# Patient Record
Sex: Female | Born: 1975 | Race: White
Health system: Northeastern US, Academic
[De-identification: ages and names within clinical notes are randomized; demographics above are authoritative.]

## PROBLEM LIST (undated history)

## (undated) DIAGNOSIS — F32A Depression, unspecified: Secondary | ICD-10-CM

## (undated) DIAGNOSIS — R51 Headache: Secondary | ICD-10-CM

## (undated) DIAGNOSIS — R569 Unspecified convulsions: Secondary | ICD-10-CM

## (undated) DIAGNOSIS — F419 Anxiety disorder, unspecified: Secondary | ICD-10-CM

## (undated) DIAGNOSIS — I10 Essential (primary) hypertension: Secondary | ICD-10-CM

## (undated) HISTORY — DX: Essential (primary) hypertension: I10

## (undated) HISTORY — DX: Anxiety disorder, unspecified: F41.9

## (undated) HISTORY — DX: Depression, unspecified: F32.A

## (undated) HISTORY — DX: Headache: R51

## (undated) HISTORY — DX: Unspecified convulsions: R56.9

---

## 2008-11-20 IMAGING — US US FETAL BPP W/O NONSTRESS
1 series · 14 of 17 positions shown · non-contrast
Comparison: none

OBSTETRICAL ULTRASOUND:

 This ultrasound exam was performed in the [HOSPITAL] Ultrasound Department.  The OB US report was generated in the AS system, and faxed to the ordering physician.  This report is also available in [REDACTED] PACS.

[Series 1: us fetal bpp w/o nonstress · non-contrast · 17 acquisitions, 14 frames shown]
[im 1/17]
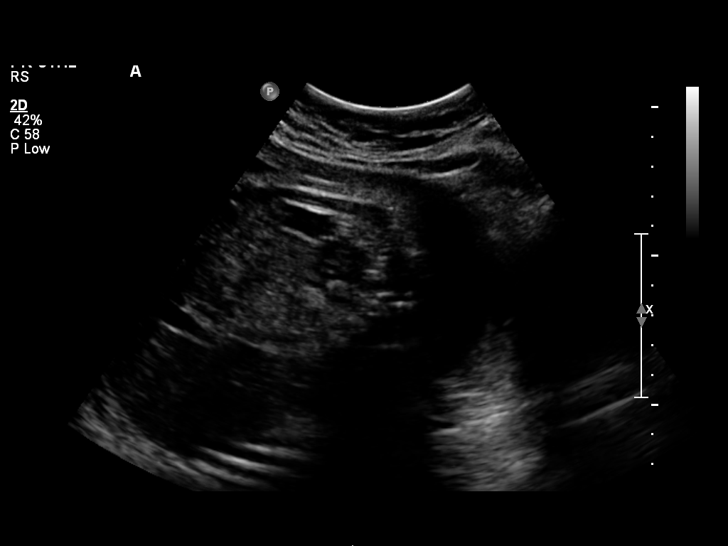
[im 2/17]
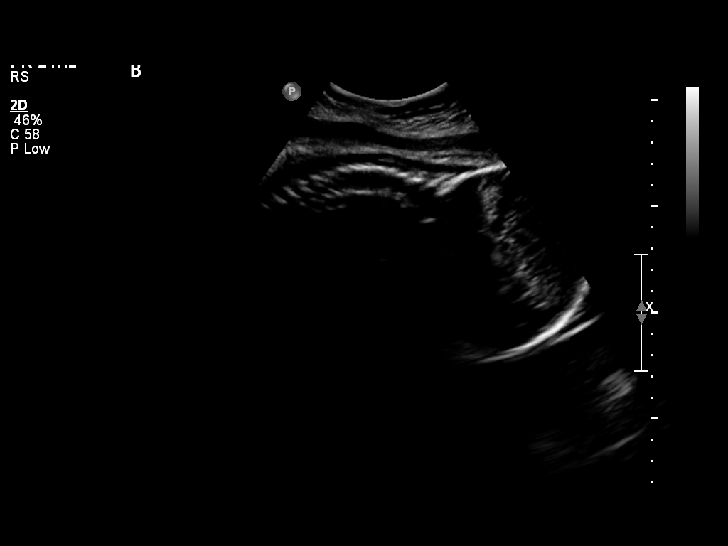
[im 4/17]
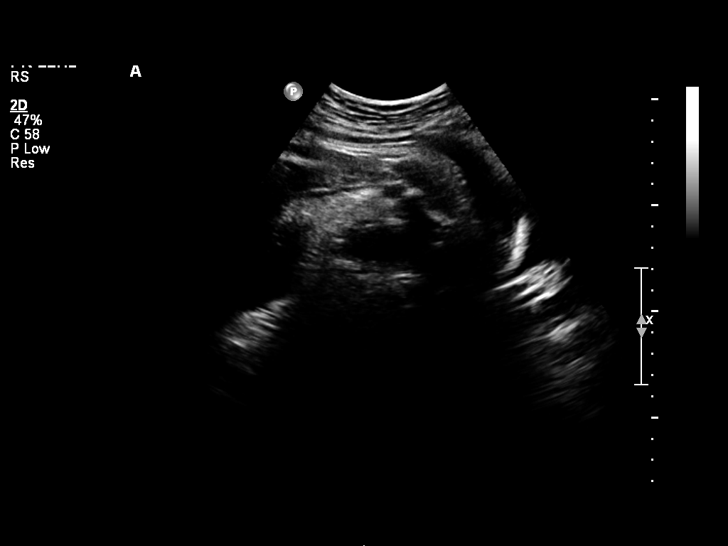
[im 5/17]
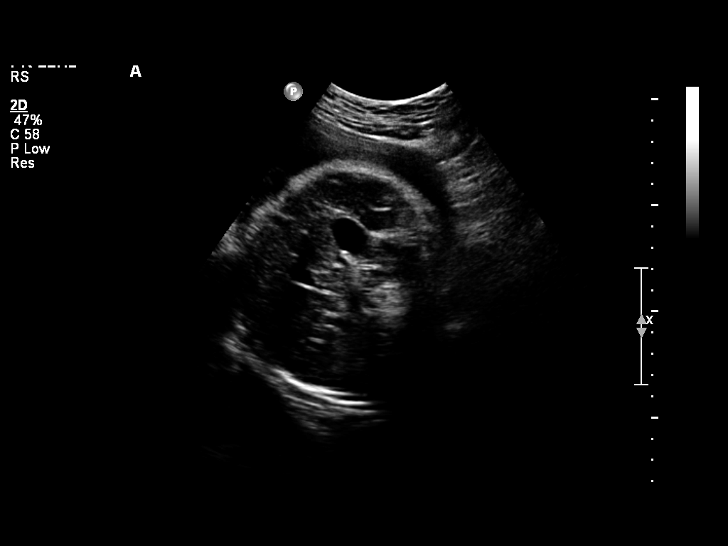
[im 6/17]
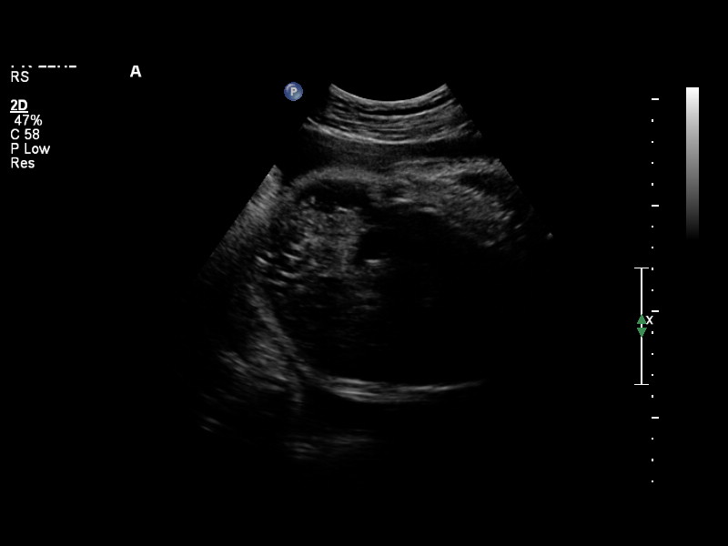
[im 7/17]
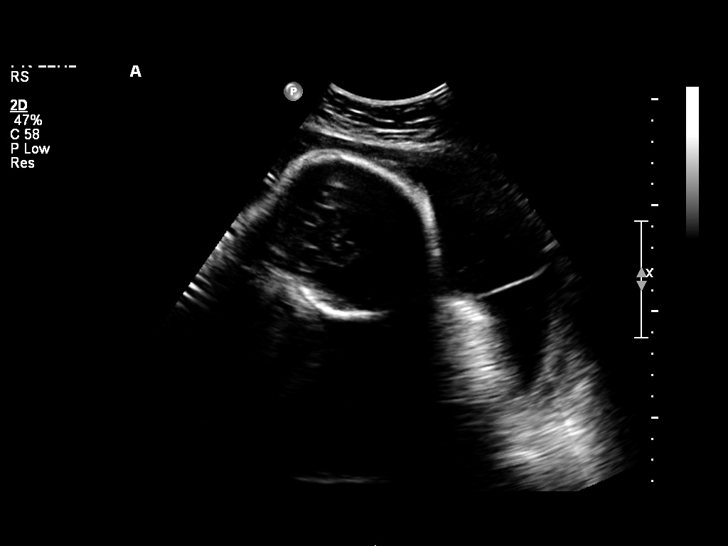
[im 8/17]
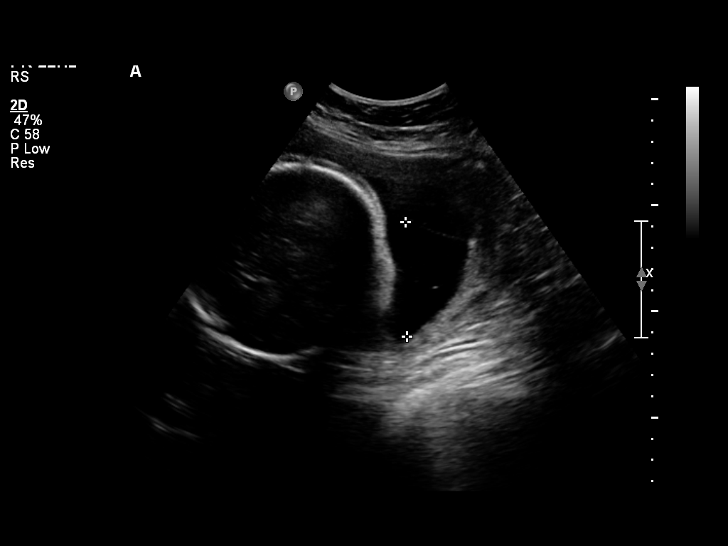
[im 10/17]
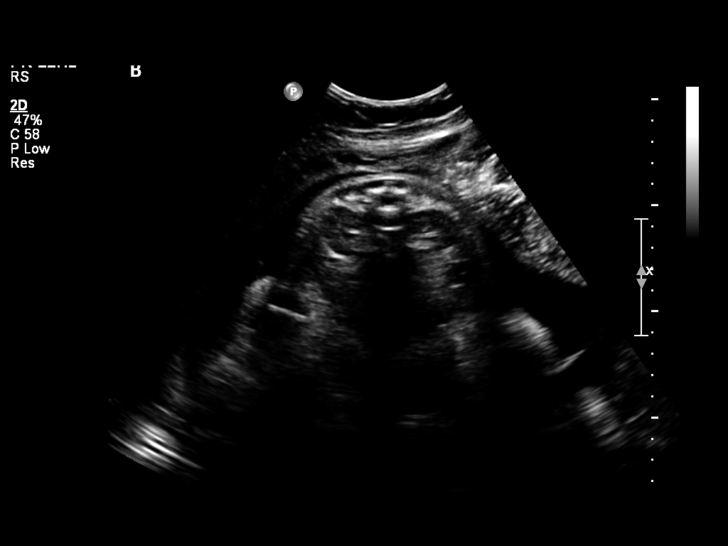
[im 11/17]
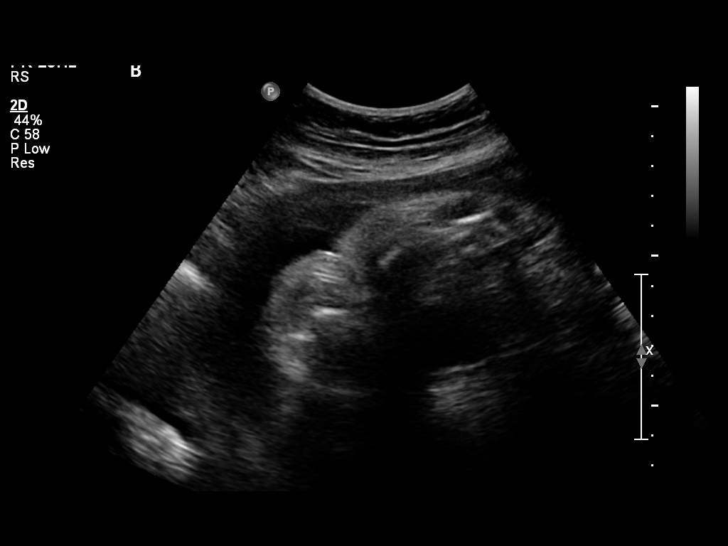
[im 12/17]
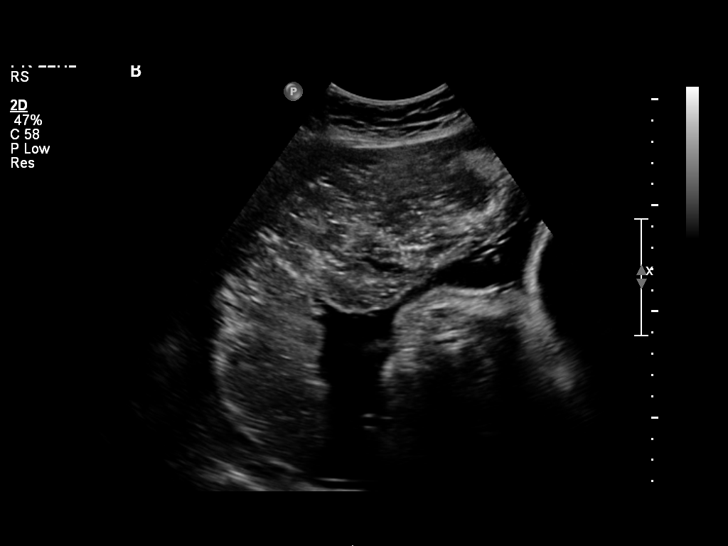
[im 13/17]
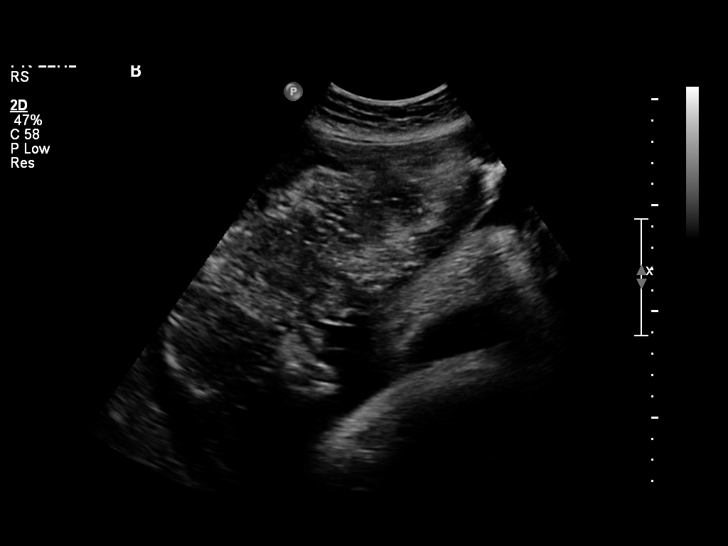
[im 14/17]
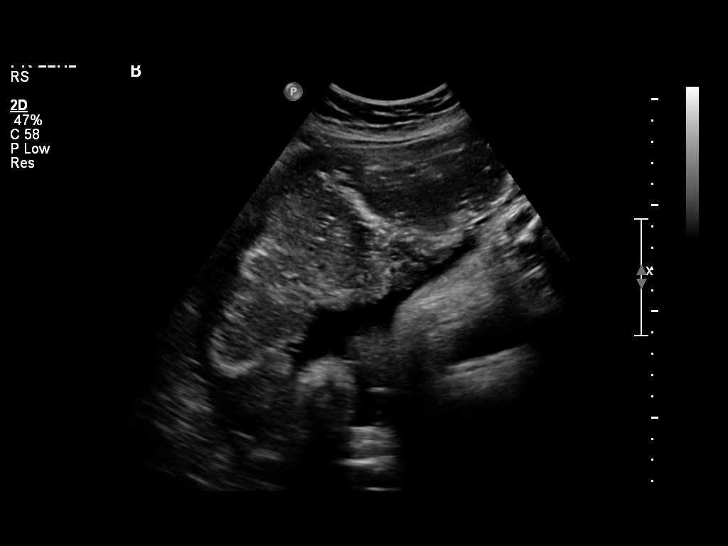
[im 16/17]
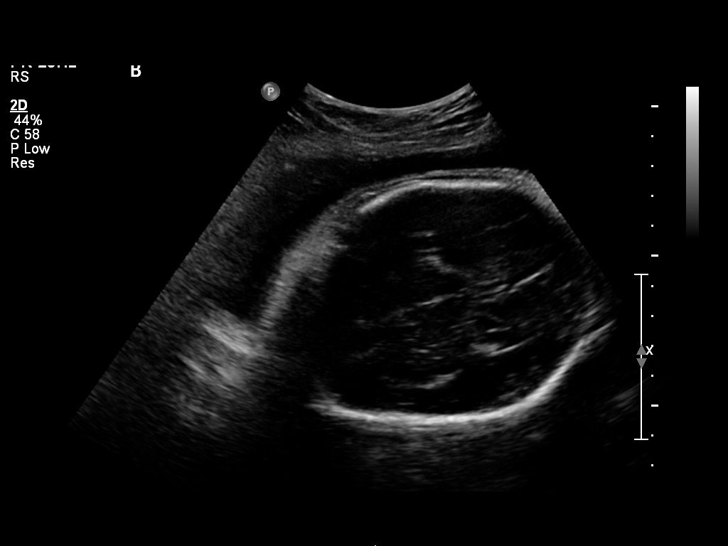
[im 17/17]
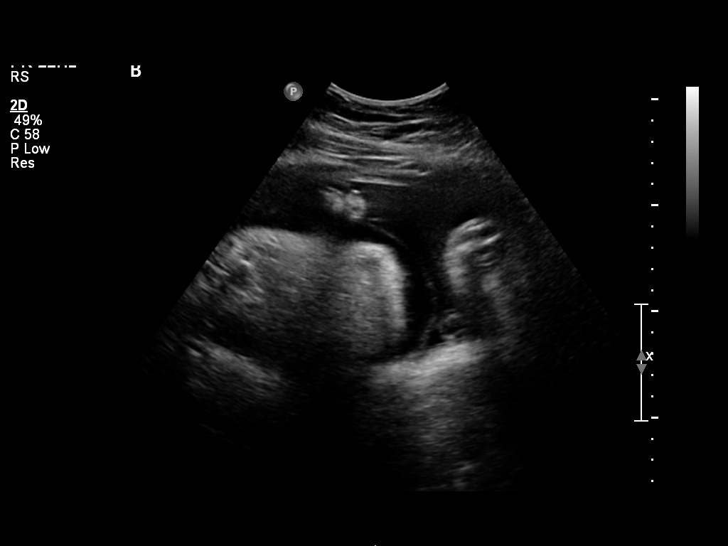

[14 of 17 positions shown; findings below may reference images not displayed]

IMPRESSION: See AS Obstetric US report.

## 2011-06-07 ENCOUNTER — Encounter: Payer: Self-pay | Admitting: Neurology

## 2011-06-07 ENCOUNTER — Ambulatory Visit: Payer: Self-pay

## 2011-06-07 ENCOUNTER — Ambulatory Visit: Payer: Self-pay | Admitting: Neurology

## 2011-06-07 VITALS — BP 123/91 | HR 91 | Ht 64.0 in | Wt 233.0 lb

## 2011-06-07 DIAGNOSIS — G40309 Generalized idiopathic epilepsy and epileptic syndromes, not intractable, without status epilepticus: Secondary | ICD-10-CM

## 2011-06-07 DIAGNOSIS — G40319 Generalized idiopathic epilepsy and epileptic syndromes, intractable, without status epilepticus: Secondary | ICD-10-CM

## 2011-06-07 MED ORDER — LORAZEPAM 0.5 MG PO TABS *I*
0.5000 mg | ORAL_TABLET | Freq: Four times a day (QID) | ORAL | Status: DC | PRN
Start: 2011-06-07 — End: 2012-04-18

## 2011-06-07 NOTE — Patient Instructions (Signed)
You have been having a seizure every 6 months and they begin with a prolonged period of altered behavior that can last about an hour and ends with a generalized convulsion.    You had an EEG completed today the results are pending.     Increase dose of lamotrigine to a total of 600 mg a day.    Week 1: Take 1.5 tablets (300 mg in the morning) and 1.25 tablets (250 mg at night)  Week 2: Take 1.5 tablets twice a day (300 mg twice daily)    Check a lamotrigine level in about 1 week after the second increase.     If your have one of these periods when you are altered, please take Ativan 0.5 mg to see if the event will stop prior to having a generalized seizure. If you have a generalized seizure and recover, you can have a second dose to prevent the second generalized seizure and a trip to the emergency room    Please call if there are any problems with the medications or worsening of the seizures.    You should follow up at the [facility] in 6 months with one of the nurse practitioners.     You should meet with the local [facility] to identify some supports that may be possible as you try to finish school and raise your family.

## 2011-06-07 NOTE — Progress Notes (Signed)
Subjective:      Chelsea Chen is a 35 y.o. right handed female seen at the [facility] for the establishment of care for medically intractable generalized epilepsy.  She is seen accompanied by her maternal aunt.    History of Present Illness:  Onset of symptoms was at age 65-7 years when she was in second grade. She was sent for frequent evaluations of her hearing and vision by the school. She recalls sitting in front of her teacher and being chastised after being called to the desk and not responding. There was a period of time when she was teased as a third grader for taking her shoes off in class. It was not until she was in 4th grade when the events were recognized by her teacher and the school nurse and she was diagnosed with epilepsy. She had an EEG that suggested that she had generalized discharges and she was started on Depakene and later Depakote and she did not have any more events. She was weaned off of medications around 1991 and she had not further issues.    In the Spring of 2006 when she was 28,  two years after her first child was born, she had a generalized seizure. She was in [place], [place] at the time and she was seen in [facility] and was started on Topamax. While on a dose of 150 mg a day she felt "stupid". She was trying to hold a job and go to college at the time. After 6 months she ran out of insurance and stopped taking her medications after 6 months.     In 2008 two days after she had the twins in [place] she had another GTC. She was evaluated at [facility]. A couple months later she had more episodes ands she was told she had "pseudoseizures" and she was confused that she was faking seizures. She was started on Keppra at a dose of 1000 mg twice a day. Because of her side effects on this medication, she was also started on Zoloft and Adderal. She was then evaluated at [facility] and she was switched from Keppra to Lamictal XR at a dose of 500 mg a day. This has been a better  tolerated medication for her and she does not experience any side effects at this time.     Since then, she has had a generalized seizure every 6 months of so. The common feature is that she has her menstrual cycle that day or the following day. She would be post ictal for 2-3 days afterwards.     In December 2011, she had Implanon implanted to control her hormones. Her next seizure was at the end of December 2011 and it was much shorter and she did not have her period. The following seizure was 02/24/11 and most recently 06/04/11.     The description of her most recent two events. On the morning of 02/24/11 she was able to get ready and walk to work but her husband already noted that something was wrong with her behavior. She sat at her desk and tehn got up wtihout a pass and allowed her phone to continue to ring. She walked away somewhere and her boss came to talk to her and she did not seem to be processing what he had to say. She sat back down at her desk and had trouble finding her headset which was just in front of her. Then she had a generalized convulsion and taken to the  hospital. She remained there until later that evening. The most recent episode was while she was at her grandfather's house and her aunt was preparing breakfast and asked her to set the table. She picked up some dishes and put them on the table without setting it. Then she was asked to get the butter and found it but slapped it onto the table also. She was able to eat but was staring and her pupils were dilated. She sat on the couch and had bilateral arm stiffening in an extended posture and bilateral shaking. Her head was slightly turned to the right. The seizure lasted 1 minute and she was postictal for another 2-3 minutes. She was relaxing and her family was watching her until she went to the bathroom indeprndently. She had a second seizure in the bathroom. She still has a tongue bite apparent from that event. She was not aware of this  prodrome.     Her greatest concerns is that her children have to witness her seizures. Her oldest already knows how to call 911 and take care of the twins when she is in a seizure. 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     Risk Factors for Epilepsy:  She does have a history of a normal pregnancy and delivery. Neonatal history was normal. She does have a history of normal development. She does have a family history of seizure disorder in a distant cousin who has a post-traumatic epilepsy. She does have a history of head trauma when she was 75-45 years of age when she was swinging from a chain and fell backwards on a concrete barn floor. She did not lose consciousness or have any bleeding. She was not taken to the hospital and did not have any apparent sequelae.  She does not have a history of CNS infections.  She does not have a history of CVA.    Prior Evaluations:  EEG: She has had EEG that have been interpreted as generalized discharges.   CT Head: Every time she goes to the ED she has a head CT that has been normal with the exception of soft tissue injuries as a result of her falls.   MRI: By report these have been normal in numerous locations and will be loaded in our radiology department.     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    Allergies   Allergen Reactions   . Environmental Allergies    . No Known Drug Allergy    . No Known Latex Allergy      Prior to Admission medications    Medication Sig Start Date End Date Taking? Authorizing Provider   PARoxetine (PAXIL) 20 MG tablet Take 20 mg by mouth every morning       Yes [provider]   cetirizine (ZYRTEC) 10 MG tablet Take 10 mg by mouth  daily       Yes [provider]   traZODone (DESYREL) 50 MG tablet Take 50 mg by mouth nightly       Yes [provider]   fluticasone (FLONASE) 50 MCG/ACT nasal spray 1 spray by Nasal route daily       Yes  [provider]   lisinopril (PRINIVIL,ZESTRIL) 10 MG tablet Take 10 mg by mouth daily       Yes [provider]   hydrochlorothiazide (HYDRODIURIL) 50 MG tablet Take 50 mg by mouth every morning       Yes [provider]     Past Medical History:  Hypertension  Anxiety   Depression    Social History:   She lives with her husband and three children ages 70 and 67 and 32. The highest level of education achieved was a bachelor's degree. She is currently working on her Masters of [employer] on line from [employer].  She  currently does work and her husband stays home with the children. Now that they are in school he will be looking for work.  She gets food stamps. She has no other sources of income.  She does drive at times because her husband does not. She does not have family near by who can give them rides. Her aunt came from [place] to pick her up for a family event this weekend and to her appointment today.  She does not smoke. She drinks 1 cup of coffee in the morning and Diet Coke with Lime in the evening.  She does not have a history of alcohol abuse. She does not have a history of drug abuse. She does not have a history of abuse. She does have a history of mental health issues with depression and anxiety but is not able to have therapy at this time. She is on paroxetine and trazodone at night. She has previously been on Xanax and other medications. Her greatest stressors is trying to raise her family, go to school and maintain a job while having seizures without the assistance of her parents. She tried to move as they moved but this lifestyle was too unstable for her and the children. She lived in [place] for 5 years which was the longest she has  lived in any location.     Review of Systems  She did completed a 14 point review of systems scanned in the chart. Of note she has been having a change in her menstrual cycle since the Implanon. She currently has a cough. She has some dry skin and dry mouth from her medications. She has some neck discomfort.      Objective:      BP 123/91  Pulse 91  Ht 1.626 m (5\' 4" )  Wt 105.688 kg (233 lb)  BMI 39.99 kg/m2  GENERAL: Well developed, overweight, in no apparent distress.   MENTAL STATUS: Awake, alert and oriented x 3. Speech fluent. Mood and affect were appropriate to situation.   CRANIAL NERVES: EOMs were full without nystagmus. Facial sensation and musculature were symmetric.   MOTOR: Bulk and tone were normal throughout. No pronator drift.   SENSATION: Intact in all 4 extremities.   COORDINATION: Finger to nose normal, no ataxia on exam.   GAIT: Normal narrow based gait.       Assessment:     Chelsea Chen is a 35 year old with a history of idiopathic generalized epilepsy and has been having a seizure every 6 months or so. The events begin with a prolonged period of altered behavior that can last about an hour and ends with a generalized convulsion.    I recommended an EEG so that we could see her waveforms to see if there was a possibility of secondary bilateral synchrony. We arranged for the study  for today since travel is an issue for her.     We spoke about strategies to stop these events by increasing the dose of lamotrigine to a total of 600 mg a day.  Week 1: Take 1.5 tablets (300 mg in the morning) and 1.25 tablets (250 mg at night)  Week 2: Take 1.5 tablets twice a day (300 mg twice daily)  Then she should check a lamotrigine level in about 1 week after the second increase.     Alternative medications also could include zonisamide in the future.     We also spoke about preventing the progression of the events prior to the generalized seizure. As soon as her family members recognize that she is having  one of these periods when she is altered, she should be given Ativan 0.5 mg to see if the event will stop prior to having a generalized seizure. If the generalized seizure occurs and she recovers, she can have a second dose to prevent the second generalized seizure and a trip to the emergency room     I informed [employer] [place] that she is restricted from driving a motor vehicle due to the seizures under [employer]. Driving is a privilege governed by the [employer] (DMV). Permission for driving is not granted by the physician. I reviewed the [place] state DMV rules on driving restrictions. [place] is a non-physician reporting state. That is, physicians are not permitted to report seizures to the state as there is no physician immunity for such a violation of patient confidentiality. However, patients are expected to report the presence of seizures or other events that impair function and could result in a collision to the [facility]. [place] restricts driving until the patient is free of impairing events for one year. In some cases, through an appeal process with the [facility] medical board, driving rights can be restored after 6 months of seizure freedom, usually if the breakthrough seizure was due to a specific circumstance that has been corrected. When she is seizure-free for an appropriate time period, I will be happy to complete the [facility] paperwork.    You should meet with the local [facility] to identify some supports that may be possible as you try to finish school and raise your family.  If she should qualify for disability we can fill out paperwork on your behalf.      Plan:     She can follow up at the [facility] in in 6 months with one of the nurse practitioners.     Lubertha Basque, MD

## 2011-06-08 ENCOUNTER — Encounter: Payer: Self-pay | Admitting: Neurology

## 2011-06-08 NOTE — Procedures (Addendum)
PROCEDURE: Outpatient EEG.    HISTORY:  Chelsea Chen is a 35 year old female with a history of generalized seizures, which occur several times per year.  This EEG is requested to evaluate for epileptiform abnormalities.     MEDICATIONS:  Lamictal    DESCRIPTION:  The recording was performed on an XLTEK Digital EEG machine utilizing at least 21 electrodes measured and applied according to the International 10-20 system.  The duration of the study was 41 minutes.    The EEG was recorded in the waking and natural sleep states.  Photic stimulation and hyperventilation were performed.    The waking background shows appropriate organization with clearly defined anterior-posterior voltage and frequency gradients.  There is a well-defined posterior dominant rhythm of 10 Hertz, which is symmetrical and shows normal reactivity.  Anteriorly, there is the expected pattern of lower voltage and more irregular mixed faster frequencies.  The responses to photic stimulation were unremarkable. During hyperventilation there are periods of diffuse background slowing in theta and delta range.     Frequent bursts of 2-4 seconds of high amplitude diffuse frontocentral  theta and delta slowing that intermixed with waves with sharp morphology. Although sharply contoured, these are never clearly epileptiform. These bursts were seen during hyperventilation and during drowsy state and there were no behavioral changes associated with them    The sleep background is appropriately organized with well-developed spindles and vertex waves.  These sleep transients show appropriate morphology and are bilaterally synchronous and symmetrical.    IMPRESSION:  This is an abnormal waking and sleep EEG due to recurrent burts of diffuse slowing with sharp features that were seen only during awake and drowsy states. The findings indicate mild diffuse non-specific encephalopathy. There were no unequivocal epileptiform discharges.    Refat Assad DO    I have  personally interpreted this EEG. I have reviewed and edited the resident's/fellow's note and confirm the findings as documented above.    Lisette Abu, DO

## 2011-06-09 ENCOUNTER — Telehealth: Payer: Self-pay | Admitting: Neurology

## 2011-06-09 NOTE — Telephone Encounter (Signed)
[facility] pharmacy wants clarification on Lorazepam directions they are not sure what MR after GTC means? Please let me know so I can call them back. Thanks

## 2011-06-09 NOTE — Telephone Encounter (Signed)
THANKS

## 2011-06-10 LAB — LAMICTAL LEVEL: Lamotrigine: 6.7 ug/mL (ref 1.5–10.0)

## 2011-06-25 ENCOUNTER — Encounter: Payer: Self-pay | Admitting: Neurology

## 2011-06-25 ENCOUNTER — Encounter: Payer: Self-pay | Admitting: Gastroenterology

## 2011-06-25 ENCOUNTER — Telehealth: Payer: Self-pay | Admitting: Neurology

## 2011-06-25 MED ORDER — LAMOTRIGINE 200 MG PO TABS *I*
ORAL_TABLET | ORAL | Status: DC
Start: 2011-06-25 — End: 2011-07-07

## 2011-06-25 NOTE — Telephone Encounter (Signed)
TC from pt.  She states that following the last visit with Dr. Verdie Mosher on 10/15, LTG was increased from 500mg /daily to 600mg  daily in 2 steps.      Since then, she has been having daily headaches which start about 1 hour after waking ( after LTG) and last all day, despite taking tylenol every 4 hours.  She rates HA pain 5-6 on a 1-10 pain scale.  Pain encircles the back of her head near ponytail area.  Denies N/V but has light sensitivity.  It does not wake her while sleeping but she has noticed trouble falling asleep lately.    She is on BP meds but BP has been stable.  BP was 123/91 on 10/15.      She had a recent sinus infection which was treated with z-pack which ended 1 week ago.      She has problems with anxiety but that has been stable.  She has not had feelings of increased stress/anxiety lately.    No seizures since her last visit with Dr. Verdie Mosher.  Last seizure 10/12.    LTG level on 10/15 was 6.7 (on 500mg  daily).    EEG 06/08/11--IMPRESSION:   This is an abnormal waking and sleep EEG due to recurrent burts of diffuse slowing with sharp features that were seen only during awake and drowsy states. The findings indicate mild diffuse non-specific encephalopathy. There were no unequivocal epileptiform discharges.    The causes of her HAs/sleep disturbance is unclear due to the many possible triggers as listed above.  I did explain that LTG can cause both of these side effects especially with the latest medication increase.  However, it is possible that the sinus infection has not yet fully cleared up, exacerbating her HAs.      Plan:   As a trial, I have asked her to reduce her LTG by 50mg  in AM to 550mg  daily x 1 week.     If HAs improve, we still do not have a definite answer.     At that point, I asked her to retrial the LTG increase to 600mg /day.  If the HAs return, then it is a AE of LTG and we have found her max tolerated dose.   FU in April as previously planned.  Call sooner if seizures occur or if  HA do not improve.

## 2011-06-25 NOTE — Telephone Encounter (Signed)
Patient called today w/ complaints of headaches. She wonders if this could be related to a recent increase Lamictal medication. She states that they are very painful & that her sleep is affected. She also would like her EEG Results. She asked that someone please call her back.

## 2011-07-07 ENCOUNTER — Other Ambulatory Visit: Payer: Self-pay | Admitting: Neurology

## 2011-07-07 MED ORDER — LAMOTRIGINE 200 MG PO TABS *I*
ORAL_TABLET | ORAL | Status: DC
Start: 2011-07-07 — End: 2011-12-06

## 2011-07-09 ENCOUNTER — Encounter: Payer: Self-pay | Admitting: Neurology

## 2011-08-04 ENCOUNTER — Encounter: Payer: Self-pay | Admitting: Neurology

## 2011-08-05 ENCOUNTER — Telehealth: Payer: Self-pay | Admitting: Neurology

## 2011-08-05 MED ORDER — LAMOTRIGINE 200 MG PO TABS *I*
ORAL_TABLET | ORAL | Status: DC
Start: 2011-08-05 — End: 2011-12-06

## 2011-09-30 ENCOUNTER — Other Ambulatory Visit: Payer: Self-pay | Admitting: Neurology

## 2011-11-22 ENCOUNTER — Encounter: Payer: Self-pay | Admitting: Neurology

## 2011-12-03 ENCOUNTER — Telehealth: Payer: Self-pay | Admitting: Neurology

## 2011-12-03 NOTE — Telephone Encounter (Signed)
Pt had seizure on Monday and was taken to [facility]. They kept her for the night. They increased her lamictal. She now takes 400 mgs daily. She has appt here on Monday but wanted to make the provider aware.

## 2011-12-03 NOTE — Telephone Encounter (Signed)
Noted  

## 2011-12-06 ENCOUNTER — Ambulatory Visit: Payer: Self-pay | Admitting: Neurology

## 2011-12-06 DIAGNOSIS — G40919 Epilepsy, unspecified, intractable, without status epilepticus: Secondary | ICD-10-CM

## 2011-12-06 MED ORDER — LIDOCAINE VISCOUS 2 % MT SOLN *I*
OROMUCOSAL | Status: AC
Start: 2011-12-06 — End: ?

## 2011-12-06 NOTE — Progress Notes (Signed)
HPI:  Chelsea Chen is a 36 y.o. female who returns for evaluation of seizures. She was last seen on 06/07/11 by Dr Verdie Mosher for medically intractable generalized epilepsy. He aunt accompanied her to the appointment today.    Since her last appointment, she had generalized tonic clonic seizures on December 13th and April 12th. Her most recent seizure occurred at the DSS office where she was  completing the application for Public Assistance. They transported her to the local hospital where she was observed and released. She chewed the sides of her tongue during this seizure. She is compliant with her current medications but doesn't feel as though they are working for her. Her Lamictal dose at the time was 300 mg bid and they increased the dose to 400 mg bid in the ED.She is tolerating it without any side effects.    She had her Implanon removed last year, as she continued to have seizures after it had been in for about a year.     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 She applied for Public Assistance and is going to obtain disability forms from the DSS for Dr Verdie Mosher to complete. Her husband has been out of work and is applying for a part time job. Ideally, she would like for him to work part time and then for her to obtain a part time job. She is still in school for a Master's in [employer].     She continues to have headaches but they have become milder since she stopped taking daily Excedrin Migraine.     Epilepsy Medications:   Lamictal 400 mg bid    ROS: is significant for headaches and a sore tongue.     EXAM: Patient was awake, alert and oriented. Speech was fluent. She had healing abrasions on the lateral aspect of her tongue. Mood and affect were appropriate to the situation. Face symmetric, PERRL, and EOMs were full without nystagmus. There was no tremor, pronator drift was negative, and she performed finger to nose  without dysmetria. Reflexes were 2/2 and the functional strength was full. She ambulated with a normal gait and stride.     There were no vitals taken for this visit.    DATA: Lamictal level on 06-10-11 was 6.7.     ASSESSMENT: Pati has medically intractable epilepsy and continues to have convulsions two to three times a year. She doesn't feel that Lamictal is helping her seizures. Her level may not have been high enough on the previous dose.    PLAN:   1. Check a baseline Lamictal level in one week and call for the results a few days later.   2. If Lamictal level is low, will increase dose to efficacy or tolerance.   3. If she continues to have seizures on Lamictal at the higher dose, then we may need to add Zonegran as previously suggested by Dr Verdie Mosher.  4. I prescribed Lidocaine viscous 2%--apply to tongue with q-tip four times daily prn for pain  5. Return to clinic in 4 months.

## 2011-12-06 NOTE — Patient Instructions (Addendum)
Have a Lamictal level checked in one week. Call our office [phone] in one week for the results.  Call the local [facility] Foundation to see if they can give you help with job seeking.

## 2011-12-07 ENCOUNTER — Encounter: Payer: Self-pay | Admitting: Neurology

## 2011-12-08 ENCOUNTER — Encounter: Payer: Self-pay | Admitting: Neurology

## 2011-12-08 NOTE — Progress Notes (Signed)
Faxed DSS physician's medical report to [phone] also mailed copy to pt 12-08-11

## 2012-01-20 ENCOUNTER — Other Ambulatory Visit: Payer: Self-pay | Admitting: Neurology

## 2012-01-20 MED ORDER — LAMOTRIGINE 200 MG PO TABS *I*
ORAL_TABLET | ORAL | Status: DC
Start: 2012-01-20 — End: 2012-01-28

## 2012-01-20 NOTE — Telephone Encounter (Signed)
Pts car broke down. She is unable to make it to her usual pharmacy and is asking that a 1 month supply be sent to neighborhood pharmacy close for her to walk to it. She only has one dose left.

## 2012-01-21 ENCOUNTER — Encounter: Payer: Self-pay | Admitting: Neurology

## 2012-01-22 ENCOUNTER — Encounter: Payer: Self-pay | Admitting: Neurology

## 2012-01-27 ENCOUNTER — Encounter: Payer: Self-pay | Admitting: Gastroenterology

## 2012-01-28 MED ORDER — LAMOTRIGINE 200 MG PO TABS *I*
ORAL_TABLET | ORAL | Status: DC
Start: 2012-01-28 — End: 2012-04-18

## 2012-01-28 NOTE — Telephone Encounter (Signed)
TC to pt:  She emailed through [employer] several concerns too numerous to address via e-mail.  She picked up her last RX for Lamictal and it was generic. She takes brand and requested a new RX.--I faxed one to her pharmacy.  She had neuro psych testing done in [place] and read the report that said she has ADHD and a mood disorder. She is in school getting a Masters in [employer] and wanted to know what to do about the results. She may need special accommodations for school.--I instructed her to mail a copy to our office. After reviewing the results, we can give her recommendations and write for accommodations.  She has had 2 seizures in 6 months and was frustrated because of this. She had a Lamictal level done and will have results faxed. Once we have the level, we will either increase Lamictal if there is room or add Zonegran if her level is high.

## 2012-02-03 ENCOUNTER — Encounter: Payer: Self-pay | Admitting: Neurology

## 2012-03-02 ENCOUNTER — Encounter: Payer: Self-pay | Admitting: Neurology

## 2012-03-08 MED ORDER — LAMICTAL 25 MG PO TABS
ORAL_TABLET | ORAL | Status: DC
Start: 2012-03-08 — End: 2012-04-18

## 2012-03-21 ENCOUNTER — Other Ambulatory Visit: Payer: Self-pay | Admitting: Neurology

## 2012-03-29 ENCOUNTER — Encounter: Payer: Self-pay | Admitting: Neurology

## 2012-04-04 ENCOUNTER — Encounter: Payer: Self-pay | Admitting: Neurology

## 2012-04-18 ENCOUNTER — Ambulatory Visit: Payer: Self-pay | Admitting: Neurology

## 2012-04-18 ENCOUNTER — Other Ambulatory Visit: Payer: Self-pay | Admitting: Neurology

## 2012-04-18 ENCOUNTER — Encounter: Payer: Self-pay | Admitting: Neurology

## 2012-04-18 VITALS — BP 110/73 | HR 80 | Ht 64.5 in | Wt 237.0 lb

## 2012-04-18 DIAGNOSIS — G40319 Generalized idiopathic epilepsy and epileptic syndromes, intractable, without status epilepticus: Secondary | ICD-10-CM

## 2012-04-18 DIAGNOSIS — G40309 Generalized idiopathic epilepsy and epileptic syndromes, not intractable, without status epilepticus: Secondary | ICD-10-CM

## 2012-04-18 MED ORDER — LAMICTAL 25 MG PO TABS
ORAL_TABLET | ORAL | Status: DC
Start: 2012-04-18 — End: 2012-08-11

## 2012-04-18 MED ORDER — LAMOTRIGINE 200 MG PO TABS *I*
ORAL_TABLET | ORAL | Status: DC
Start: 2012-04-18 — End: 2012-08-11

## 2012-04-18 MED ORDER — LAMICTAL 25 MG PO TABS
ORAL_TABLET | ORAL | Status: DC
Start: 2012-04-18 — End: 2012-04-18

## 2012-04-18 MED ORDER — PAROXETINE HCL 20 MG PO TABS *I*
30.0000 mg | ORAL_TABLET | Freq: Every morning | ORAL | Status: AC
Start: 2012-04-18 — End: 2012-05-18

## 2012-04-18 MED ORDER — LORAZEPAM 0.5 MG PO TABS *I*
0.5000 mg | ORAL_TABLET | Freq: Four times a day (QID) | ORAL | Status: AC | PRN
Start: 2012-04-18 — End: ?

## 2012-04-18 NOTE — Telephone Encounter (Signed)
MAILED TO Ilda Basset W STAMP 04/18/12

## 2012-04-18 NOTE — Patient Instructions (Addendum)
You have been having more seizures over the last year with one at [facility] 2012, December 2012, February 2013, April 2013, and again in July 2013.     Lamictal has been increased to 400 mg twice daily with the ability to increase further to 450 mg at night.     We will increase Paxil 30 mg a day.     Since you will be moving to [place] you should call Dr. Hoover Brunette, Inis Sizer., MD    Neurology Clinic  [facility]  [address], [address]  [place], [place] [address]    For appointment information:  Telephone: [phone]  Fax: [phone]    Follow up at the [facility] as you need.

## 2012-04-18 NOTE — Progress Notes (Signed)
Subjective:      Chelsea Chen is a 36 y.o. right handed female seen at the [facility] for medically intractable generalized epilepsy. She is unaccompanied today.    History of Present Illness:  Since her initial visit in October 2012, she has had a difficult year. 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 They had called her into the office to discuss her time away from work. She was reported to be returning from break 30-90 seconds late and she was fired. She was worried then that her medications were making her lose her sense of time. She had another seizure while at home in February 2013. After she was fired, she was getting public assistance and she had a seizure there. She went to the ED and her lamotrigine dose was increased from 300 mg twice daily to 400 mg twice daily. She seemed to tolerate this change fairly well and her last seizure was this July 2013.    When she has a change in behavior prior to a seizure, her husband has tried to give her an lorazepam, but she continues to have a generalized convulsion. She has not tried a second dose because the seizure comes prior to the recommended repeat.     Social History:  She has been under increasing stressors. Since losing her job, she has not been able to find a new job because of the "seizure restrictions".  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 She was hoping to finish school this year but since she had the seizures, she had to drop a course this spring and may need to wait until next spring to take it again. She does not have the practical experience. They were offering to place her at her kids school as a volunteer so that she could get experience.    Her husband is also out of work and he was offered a job in [place]. He will be leaving soon, but they cannot go with them because they  do not have the finances at the time to travel across country. She is also worried that the children will be going to school. She does not have a support network in [place] that if she had a seizure to find care for the kids. She has neighbors whose plans are transient. The previous year teacher has offered to help but also works. Her nearest family member is in [place]. The rest of her family is in [place] area. Her husband has family in [place]. She was hoping to get home early enough today to get to the food pantry as there is no food in the house and they have no money to buy any.    Allergies   Allergen Reactions   . Environmental Allergies    . No Known Drug Allergy    . No Known Latex Allergy        Medication Sig   LAMICTAL 200 MG tablet Take 2 tablets (400 mg total) by mouth 2 times daily   LAMICTAL 25 MG tablet 1 tab at hs X 1wk,  increase by 1 tab every week as directed to target dose of 2 tab bid if seizures continue. (In addition to 200 mg tabs).   lamoTRIgine (LAMICTAL) 200 MG tablet Take 400 mg by mouth 2 times daily  naproxen (NAPROSYN) 375 MG tablet Take 375 mg by mouth 2 times daily (with meals)   lidocaine (XYLOCAINE) 2 % solution Apply with q-tip to edge of tongue 4 times daily prn for pain   PARoxetine (PAXIL) 20 MG tablet Take 20 mg by mouth every morning       cetirizine (ZYRTEC) 10 MG tablet Take 10 mg by mouth daily       traZODone (DESYREL) 50 MG tablet Take 50 mg by mouth nightly       fluticasone (FLONASE) 50 MCG/ACT nasal spray 1 spray by Nasal route daily       lisinopril (PRINIVIL,ZESTRIL) 10 MG tablet Take 10 mg by mouth daily       hydrochlorothiazide (HYDRODIURIL) 50 MG tablet Take 50 mg by mouth every morning       LORazepam (ATIVAN) 0.5 MG tablet Take 1 tablet (0.5 mg total) by mouth every 6 hours as needed (prolonged episode of altered behavior. MR after GTC. MDD 1 mg)           Review of Systems  She completed a 16 point review of systems which has been scanned. Of note  she has confusion and difficulty with her memory. She ahs been depressed and anxious with difficulty sleeping. She also has concerns about muscle pain moles and dry mouth and thirst and hunger.        Objective:      BP 110/73  Pulse 80  Ht 1.638 m (5' 4.5")  Wt 107.502 kg (237 lb)  BMI 40.07 kg/m2  GENERAL: Well nourished, well-developed, in no apparent distress.   MENTAL STATUS: Awake, alert and oriented x 3. Speech fluent. Mood and affect were appropriate to situation.   CRANIAL NERVES: Pupils were reactive bilaterally. EOMs were full without nystagmus. Facial sensation and musculature were symmetric. Palatal elevation was symmetric, and tongue was midline.   MOTOR: Bulk and tone were normal throughout. No pronator drift.   SENSATION: Intact in all 4 extremities.   COORDINATION: Finger to nose and heel to shin were both normal, no ataxia on exam.   GAIT: Normal narrow-based gait.       Assessment:      Ms Fleer has medically intractable generalized epilepsy. She is also under a number of stressors which could be exacerbating her seizures more than usual. She has tried increasing her dose of medications and seems to be tolerating the increases fairly well. We tried to abort the generalized convulsion by using the lorazepam but at the single dose it did not seem to work and in the future she can try to take a second dose within 10-15 minutes.     We discussed that her depression, anxiety and insomnia can also exacerbate her epilepsy and if we treated that it may make things slightly better.      Plan:     Increase lamotrigine 400 mg in the morning and 450 mg at night    Increase Paxil 30 mg a day.     She was referred to Dr. Jerald Kief at the [facility] [place] for further management.     Follow up at the [facility] as you need.     Lubertha Basque, MD

## 2012-05-02 ENCOUNTER — Encounter: Payer: Self-pay | Admitting: Neurology

## 2012-05-08 ENCOUNTER — Telehealth: Payer: Self-pay | Admitting: Neurology

## 2012-05-08 NOTE — Telephone Encounter (Signed)
Pasha called today to report 4 seizures on Thursday. She would like to discuss adding a new medication as discussed in the past.

## 2012-05-09 MED ORDER — ZONISAMIDE 100 MG PO CAPS *I*
ORAL_CAPSULE | ORAL | Status: DC
Start: 2012-05-09 — End: 2012-08-11

## 2012-05-09 NOTE — Telephone Encounter (Signed)
Pt on LTG 400/450. She had 4 convulsions on Thursday and bit her tongue. The last one was 2 months ago.   Plan:  Will start Zonegran 100 mg hs X 2wks, then 200 mg Hs X 2wks, then 300 mg hs as per Dr Theodora Blow note from October 2012.  Reviewed potential SE including mood changes, depression, sedation, cognitive slowing, kidney stones and anhydrosis.  Call for any further questions or concerns.    Completed job plus form and will fax today.

## 2012-05-10 ENCOUNTER — Telehealth: Payer: Self-pay | Admitting: Neurology

## 2012-05-10 NOTE — Telephone Encounter (Signed)
Chelsea Chen called today asking to speak w/ an NP. She wanted to ask about an injectable pen of medication (possibly Diastat?) that her pharmacist mentioned. She asked that you please call her back.

## 2012-05-10 NOTE — Telephone Encounter (Signed)
Left voicemail that injectable Valium is not available at this time. Call for further questions or concerns.

## 2012-05-14 ENCOUNTER — Encounter: Payer: Self-pay | Admitting: Neurology

## 2012-05-16 NOTE — Telephone Encounter (Signed)
Secretary, Please cancel her NP appointment with me in October. She will call and reschedule to see Dr Verdie Mosher next year. Thanks.

## 2012-05-22 ENCOUNTER — Encounter: Payer: Self-pay | Admitting: Neurology

## 2012-05-29 ENCOUNTER — Ambulatory Visit: Payer: Self-pay

## 2012-06-08 ENCOUNTER — Ambulatory Visit: Payer: Self-pay | Admitting: Neurology

## 2012-06-26 ENCOUNTER — Telehealth: Payer: Self-pay | Admitting: Neurology

## 2012-06-26 DIAGNOSIS — G40309 Generalized idiopathic epilepsy and epileptic syndromes, not intractable, without status epilepticus: Secondary | ICD-10-CM

## 2012-06-26 NOTE — Telephone Encounter (Signed)
Pt called. Asked to please not approve this script yet. She is in process of moving to [place] and will call when she gets there to let us know where she needs the script sent to.

## 2012-06-26 NOTE — Telephone Encounter (Signed)
Noted. Will wait for pt to contact us for her next refill.

## 2012-08-10 ENCOUNTER — Encounter: Payer: Self-pay | Admitting: Neurology

## 2012-08-11 ENCOUNTER — Other Ambulatory Visit: Payer: Self-pay | Admitting: Neurology

## 2012-08-11 MED ORDER — LAMOTRIGINE 25 MG PO TABS *I*
ORAL_TABLET | ORAL | Status: DC
Start: 2012-08-11 — End: 2013-05-10

## 2012-08-11 MED ORDER — LAMOTRIGINE 200 MG PO TABS *I*
ORAL_TABLET | ORAL | Status: DC
Start: 2012-08-11 — End: 2012-08-11

## 2012-08-11 MED ORDER — ZONISAMIDE 100 MG PO CAPS *I*
ORAL_CAPSULE | ORAL | Status: DC
Start: 2012-08-11 — End: 2012-11-17

## 2012-08-11 MED ORDER — LAMOTRIGINE 200 MG PO TABS *I*
ORAL_TABLET | ORAL | Status: DC
Start: 2012-08-11 — End: 2013-05-10

## 2012-08-28 ENCOUNTER — Telehealth: Payer: Self-pay | Admitting: Neurology

## 2012-08-28 NOTE — Telephone Encounter (Signed)
Called pharmacist and he said that secretary already verified LTG dose.

## 2012-08-28 NOTE — Telephone Encounter (Signed)
CALL [facility] [phone] -REGARDING 800MG  LAMICTAL - THEY WANT A VERBAL APPROVAL OF HIGH DOSE

## 2012-08-29 ENCOUNTER — Encounter: Payer: Self-pay | Admitting: Neurology

## 2012-11-16 ENCOUNTER — Encounter: Payer: Self-pay | Admitting: Neurology

## 2012-11-17 MED ORDER — ZONISAMIDE 100 MG PO CAPS *I*
ORAL_CAPSULE | ORAL | Status: DC
Start: 2012-11-17 — End: 2013-03-28

## 2012-12-13 ENCOUNTER — Encounter: Payer: Self-pay | Admitting: Neurology

## 2012-12-14 ENCOUNTER — Encounter: Payer: Self-pay | Admitting: Neurology

## 2013-03-28 ENCOUNTER — Other Ambulatory Visit: Payer: Self-pay | Admitting: Neurology

## 2013-03-28 MED ORDER — ZONISAMIDE 100 MG PO CAPS *I*
ORAL_CAPSULE | ORAL | Status: AC
Start: 2013-03-28 — End: ?

## 2013-05-10 ENCOUNTER — Other Ambulatory Visit (INDEPENDENT_AMBULATORY_CARE_PROVIDER_SITE_OTHER): Payer: Self-pay

## 2013-05-10 ENCOUNTER — Other Ambulatory Visit: Payer: Self-pay | Admitting: Neurology

## 2013-05-10 MED ORDER — LAMOTRIGINE 25 MG PO TABS *I*
ORAL_TABLET | ORAL | Status: AC
Start: 2013-05-10 — End: ?

## 2013-05-10 MED ORDER — LAMOTRIGINE 200 MG PO TABS *I*
ORAL_TABLET | ORAL | Status: AC
Start: 2013-05-10 — End: ?

## 2013-05-10 NOTE — Telephone Encounter (Signed)
Left message for patient to return call @ [phone]

## 2013-05-10 NOTE — Telephone Encounter (Signed)
MAIL DIRECTLY TO [facility]   [address]  [place]  [address]

## 2013-05-10 NOTE — Telephone Encounter (Signed)
Please contact patient (I believe she is now in [place], as she requested script to be sent there).  Not seen since August 2013 and no longer lives here.    Please advise her to find local neurologist to manage her epilepsy and prescribe meds, as she will have to be seen in order for [place] to continue to treat.    I gave her enough for 2 months of Lamictal. Thanks.

## 2013-06-14 ENCOUNTER — Encounter: Payer: Self-pay | Admitting: Neurology

## 2013-07-01 NOTE — Progress Notes (Signed)
PER PATIENT'S WRITTEN REQUEST, FAXED NOTES 05/2011, 11/2011, 03/2012, AND 05/2011 EEG REPORT  TO [facility], [phone], PH: [phone] OPTION 4.  ALSO UPDATED PT'S DEMOGRAPHICS IN [facility]

## 2015-02-11 ENCOUNTER — Telehealth: Payer: Self-pay | Admitting: Neurology

## 2015-02-11 NOTE — Telephone Encounter (Signed)
Received a signed ROI from the patient requesting past records. Faxed ROI to [facility] Records Dept and placed ROI into scanning.
